# Patient Record
Sex: Male | Born: 2000 | Race: Black or African American | Hispanic: No | Marital: Single | Smoking: Never smoker
Health system: Southern US, Community
[De-identification: ages and names within clinical notes are randomized; demographics above are authoritative.]

## PROBLEM LIST (undated history)

## (undated) DIAGNOSIS — T7840XA Allergy, unspecified, initial encounter: Secondary | ICD-10-CM

---
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
## 2021-08-07 ENCOUNTER — Ambulatory Visit (INDEPENDENT_AMBULATORY_CARE_PROVIDER_SITE_OTHER): Payer: Medicaid Other

## 2021-08-07 ENCOUNTER — Ambulatory Visit
Admission: EM | Admit: 2021-08-07 | Discharge: 2021-08-07 | Disposition: A | Discharge status: Home / Self Care | Payer: Medicaid Other | Attending: Internal Medicine | Admitting: Internal Medicine

## 2021-08-07 DIAGNOSIS — M79672 Pain in left foot: Secondary | ICD-10-CM

## 2021-08-07 DIAGNOSIS — M79674 Pain in right toe(s): Secondary | ICD-10-CM | POA: Diagnosis not present

## 2021-08-07 MED ORDER — TETANUS-DIPHTH-ACELL PERTUSSIS 5-2.5-18.5 LF-MCG/0.5 IM SUSY
0.5000 mL | PREFILLED_SYRINGE | Freq: Once | INTRAMUSCULAR | Status: AC
  Administered 2021-08-07: 0.5 mL via INTRAMUSCULAR

## 2021-08-07 NOTE — Discharge Instructions (Signed)
Please monitor abrasion very closely for signs of infection that include increased redness, swelling, pus.  Your x-ray was normal and did not show signs of break or dislocation.  Suspect possible sprain versus bruising.  Please use ice application and take over-the-counter pain relievers as needed.  Follow-up with provided contact information for orthopedist if pain persists or worsens.

## 2021-08-07 NOTE — ED Triage Notes (Addendum)
Pt present right big toe injury, pt states he stomped his toe on his dog cage. Symptom happen last Sunday. Pt toe is red and swollen.

## 2021-08-07 NOTE — ED Provider Notes (Signed)
[facility]    CSN: [id number] Arrival date & time: 08/07/21  1402      History   Chief Complaint Chief Complaint  Patient presents with   Toe Pain    HPI Colton Burns is a 21 y.o. male.   Patient presents with left foot and left great toe pain that started about 5 days ago after an injury.  Patient reports that he jumped up out of bed, and his toe got caught on the dog kennel next to his bed.  He reports that his toe was twisted during the injury.  Patient having difficulty bearing weight due to pain.  Is requesting a work note given prolonged standing at his workplace that is exacerbating pain.  Patient does not report taking medication for pain.  Last tetanus vaccine is unknown.  Has abrasion to left great toe but denies any purulent drainage, fever, body aches, chills.   Toe Pain    Past Medical History:  Diagnosis Date   Allergy     Patient Active Problem List   Diagnosis Date Noted   S/P hardware removal 08/17/2014   Fracture, femur, distal (HCC) 12/12/2013    Past Surgical History:  Procedure Laterality Date   HARDWARE REMOVAL Right 08/17/2014   Procedure: Removal Right Distal Femur Screws;  Surgeon: Eldred Manges, MD;  Location: [facility];  Service: Orthopedics;  Laterality: Right;   HERNIA REPAIR  2005   umbilical   KNEE ARTHROSCOPY WITH MEDIAL MENISECTOMY Right 12/14/2019   Procedure: RIGHT KNEE ARTHROSCOPY WITH MEDIAL MENISECTOMY and repair;  Surgeon: Bjorn Pippin, MD;  Location: [facility];  Service: Orthopedics;  Laterality: Right;   ORIF FEMUR FRACTURE Right 12/12/2013   Procedure: OPEN REDUCTION INTERNAL FIXATION (ORIF) DISTAL FEMUR FRACTURE Salter II;  Surgeon: Eldred Manges, MD;  Location: [facility] OR;  Service: Orthopedics;  Laterality: Right;       Home Medications    Prior to Admission medications   Medication Sig Start Date End Date Taking? Authorizing Provider  hydrOXYzine (ATARAX/VISTARIL) 25 MG tablet Take 0.5-1 tablets  (12.5-25 mg total) by mouth every 8 (eight) hours as needed for itching. 11/03/20   Wallis Bamberg, PA-C  predniSONE (DELTASONE) 10 MG tablet Take 3 tablets (30 mg total) by mouth daily with breakfast. 11/03/20   Wallis Bamberg, PA-C    Family History History reviewed. No pertinent family history.  Social History Social History   Tobacco Use   Smoking status: Never   Smokeless tobacco: Never  Vaping Use   Vaping Use: Never used  Substance Use Topics   Alcohol use: No   Drug use: Yes    Types: Marijuana    Comment: daily     Allergies   Penicillins   Review of Systems Review of Systems Per HPI  Physical Exam Triage Vital Signs ED Triage Vitals  Enc Vitals Group     BP 08/07/21 1439 110/60     Pulse Rate 08/07/21 1439 92     Resp 08/07/21 1439 18     Temp 08/07/21 1439 98.1 F (36.7 C)     Temp Source 08/07/21 1439 Oral     SpO2 08/07/21 1439 97 %     Weight 08/07/21 1441 140 lb (63.5 kg)     Height --      Head Circumference --      Peak Flow --      Pain Score 08/07/21 1440 7     Pain Loc --  Pain Edu? --      Excl. in GC? --    No data found.  Updated Vital Signs BP 110/60 (BP Location: Left Arm)   Pulse 92   Temp 98.1 F (36.7 C) (Oral)   Resp 18   Wt 140 lb (63.5 kg)   SpO2 97%   Visual Acuity Right Eye Distance:   Left Eye Distance:   Bilateral Distance:    Right Eye Near:   Left Eye Near:    Bilateral Near:     Physical Exam Constitutional:      General: He is not in acute distress.    Appearance: Normal appearance. He is not toxic-appearing or diaphoretic.  HENT:     Head: Normocephalic and atraumatic.  Eyes:     Extraocular Movements: Extraocular movements intact.     Conjunctiva/sclera: Conjunctivae normal.  Pulmonary:     Effort: Pulmonary effort is normal.  Feet:     Comments: Small superficial abrasion that is approximately 0.5 cm in length present to proximal joint of left great toe.  Mild swelling and surrounding erythema  located to abrasion.  No purulent drainage noted.  Tenderness to palpation to surrounding area of abrasion and slightly into dorsal surface of left foot surrounding first metatarsal.  No pain to distal end of toe.  Limited range of motion of great toe given pain but patient is able to move toe.  Patient able to move other toes as well.  Full range of motion of ankle and foot.  Capillary refill and pulses normal. Neurological:     General: No focal deficit present.     Mental Status: He is alert and oriented to person, place, and time. Mental status is at baseline.  Psychiatric:        Mood and Affect: Mood normal.        Behavior: Behavior normal.        Thought Content: Thought content normal.        Judgment: Judgment normal.      UC Treatments / Results  Labs (all labs ordered are listed, but only abnormal results are displayed) Labs Reviewed - No data to display  EKG   Radiology DG Foot Complete Right  Result Date: 08/07/2021 CLINICAL DATA:  Right big toe injury EXAM: RIGHT FOOT COMPLETE - 3+ VIEW COMPARISON:  None Available. FINDINGS: No acute fracture or dislocation.The joint spaces are preserved.Alignment is unremarkable. Mild soft tissue swelling about the big toe. No other significant soft tissue abnormality or foreign body. IMPRESSION: No acute osseous abnormality. Electronically Signed   By: Wiliam KeAlison  Vasan M.D.   On: 08/07/2021 15:02    Procedures Procedures (including critical care time)  Medications Ordered in UC Medications  Tdap (BOOSTRIX) injection 0.5 mL (0.5 mLs Intramuscular Given 08/07/21 1507)    Initial Impression / Assessment and Plan / UC Course  I have reviewed the triage vital signs and the nursing notes.  Pertinent labs & imaging results that were available during my care of the patient were reviewed by me and considered in my medical decision making (see chart for details).     Left foot x-ray was negative for any acute bony abnormality.  Suspect  muscular injury/strain versus contusion.  Abrasion to left foot does not appear infected.  Patient advised to keep clean and to monitor for signs of infection.  Tetanus vaccine updated today.  Discussed supportive care, ice application, over-the-counter pain relievers.  Patient provided with contact information for orthopedist if pain persists  or worsens.  Discussed return precautions.  Patient verbalized understanding and was agreeable with plan. Final Clinical Impressions(s) / UC Diagnoses   Final diagnoses:  Left foot pain     Discharge Instructions      Please monitor abrasion very closely for signs of infection that include increased redness, swelling, pus.  Your x-ray was normal and did not show signs of break or dislocation.  Suspect possible sprain versus bruising.  Please use ice application and take over-the-counter pain relievers as needed.  Follow-up with provided contact information for orthopedist if pain persists or worsens.    ED Prescriptions   None    PDMP not reviewed this encounter.   Gustavus Bryant, [place] 08/07/21 1520

## 2023-09-30 ENCOUNTER — Ambulatory Visit
Admission: EM | Admit: 2023-09-30 | Discharge: 2023-09-30 | Disposition: A | Discharge status: Home / Self Care | Attending: Family Medicine | Admitting: Family Medicine

## 2023-09-30 DIAGNOSIS — L309 Dermatitis, unspecified: Secondary | ICD-10-CM | POA: Diagnosis not present

## 2023-09-30 DIAGNOSIS — J209 Acute bronchitis, unspecified: Secondary | ICD-10-CM

## 2023-09-30 MED ORDER — PROMETHAZINE-DM 6.25-15 MG/5ML PO SYRP: 5.0000 mL | ORAL_SOLUTION | Freq: Four times a day (QID) | ORAL | 0 refills | Status: AC | PRN

## 2023-09-30 MED ORDER — AZITHROMYCIN 250 MG PO TABS: 250.0000 mg | ORAL_TABLET | Freq: Every day | ORAL | 0 refills | Status: AC

## 2023-09-30 MED ORDER — TRIAMCINOLONE ACETONIDE 0.1 % EX CREA: 1.0000 | TOPICAL_CREAM | Freq: Two times a day (BID) | CUTANEOUS | 0 refills | Status: AC

## 2023-09-30 NOTE — ED Triage Notes (Signed)
 Pt present with c/o a cough x one week. States he has a runny nose, throat dryness. He has taken DayQuil for relief. Pt also states he has a rash on his hands and forearm.

## 2023-09-30 NOTE — ED Provider Notes (Signed)
 [facility]    CSN: [id number] Arrival date & time: 09/30/23  1417      History   Chief Complaint Chief Complaint  Patient presents with   Cough   Rash    HPI Colton Burns is a 23 y.o. male  presents for evaluation of URI symptoms for 7 days. Patient reports associated symptoms of cough, congestion and sinus pressure if he bends over. Denies N/V/D, fevers, ear pain, sore throat, body aches, shortness of breath. Patient does note have a hx of asthma. Patient is an active smoker.   Reports no sick contacts.  In addition he reports a pruritic rash on bilateral hands and forearm for the past week.  Does state he works at [employer] and comes in contact with a lot of different products.  Does have a history of eczema but states it seems more consistent with his eczema.  pt has taken nothing OTC for symptoms. Pt has no other concerns at this time.    Cough Associated symptoms: rash   Rash   Past Medical History:  Diagnosis Date   Allergy     Patient Active Problem List   Diagnosis Date Noted   S/P hardware removal 08/17/2014   Fracture, femur, distal (HCC) 12/12/2013    Past Surgical History:  Procedure Laterality Date   HARDWARE REMOVAL Right 08/17/2014   Procedure: Removal Right Distal Femur Screws;  Surgeon: Oneil JAYSON Herald, MD;  Location: [facility];  Service: Orthopedics;  Laterality: Right;   HERNIA REPAIR  2005   umbilical   KNEE ARTHROSCOPY WITH MEDIAL MENISECTOMY Right 12/14/2019   Procedure: RIGHT KNEE ARTHROSCOPY WITH MEDIAL MENISECTOMY and repair;  Surgeon: Cristy Bonner DASEN, MD;  Location: [facility];  Service: Orthopedics;  Laterality: Right;   ORIF FEMUR FRACTURE Right 12/12/2013   Procedure: OPEN REDUCTION INTERNAL FIXATION (ORIF) DISTAL FEMUR FRACTURE Salter II;  Surgeon: Oneil JAYSON Herald, MD;  Location: [facility];  Service: Orthopedics;  Laterality: Right;       Home Medications    Prior to Admission medications   Medication Sig Start Date End Date  Taking? Authorizing Provider  azithromycin  (ZITHROMAX ) 250 MG tablet Take 1 tablet (250 mg total) by mouth daily. Take first 2 tablets together, then 1 every day until finished. 09/30/23  Yes Yuvia Plant, Jodi R, NP  promethazine -dextromethorphan (PROMETHAZINE -DM) 6.25-15 MG/5ML syrup Take 5 mLs by mouth 4 (four) times daily as needed for cough. 09/30/23  Yes Aboubacar Matsuo, Jodi R, NP  triamcinolone  cream (KENALOG ) 0.1 % Apply 1 Application topically 2 (two) times daily. 09/30/23  Yes Tyvion Edmondson, Jodi R, NP  hydrOXYzine  (ATARAX /VISTARIL ) 25 MG tablet Take 0.5-1 tablets (12.5-25 mg total) by mouth every 8 (eight) hours as needed for itching. 11/03/20   Christopher Savannah, PA-C  predniSONE  (DELTASONE ) 10 MG tablet Take 3 tablets (30 mg total) by mouth daily with breakfast. 11/03/20   Christopher Savannah, PA-C    Family History History reviewed. No pertinent family history.  Social History Social History   Tobacco Use   Smoking status: Never   Smokeless tobacco: Never  Vaping Use   Vaping status: Never Used  Substance Use Topics   Alcohol use: No   Drug use: Yes    Types: Marijuana    Comment: daily     Allergies   Penicillins   Review of Systems Review of Systems  HENT:  Positive for congestion and sinus pressure.   Respiratory:  Positive for cough.   Skin:  Positive for rash.  Physical Exam Triage Vital Signs ED Triage Vitals  Encounter Vitals Group     BP 09/30/23 1523 116/68     Girls Systolic BP Percentile --      Girls Diastolic BP Percentile --      Boys Systolic BP Percentile --      Boys Diastolic BP Percentile --      Pulse Rate 09/30/23 1523 70     Resp 09/30/23 1523 17     Temp 09/30/23 1523 98 F (36.7 C)     Temp Source 09/30/23 1523 Oral     SpO2 09/30/23 1523 97 %     Weight --      Height --      Head Circumference --      Peak Flow --      Pain Score 09/30/23 1522 4     Pain Loc --      Pain Education --      Exclude from Growth Chart --    No data found.  Updated Vital  Signs BP 116/68 (BP Location: Left Arm)   Pulse 70   Temp 98 F (36.7 C) (Oral)   Resp 17   SpO2 97%   Visual Acuity Right Eye Distance:   Left Eye Distance:   Bilateral Distance:    Right Eye Near:   Left Eye Near:    Bilateral Near:     Physical Exam Vitals and nursing note reviewed.  Constitutional:      General: He is not in acute distress.    Appearance: Normal appearance. He is not ill-appearing or toxic-appearing.  HENT:     Head: Normocephalic and atraumatic.     Right Ear: Tympanic membrane and ear canal normal.     Left Ear: Tympanic membrane and ear canal normal.     Nose: Congestion present.     Right Turbinates: Not swollen or pale.     Left Turbinates: Not swollen or pale.     Right Sinus: No maxillary sinus tenderness or frontal sinus tenderness.     Left Sinus: No maxillary sinus tenderness or frontal sinus tenderness.     Mouth/Throat:     Mouth: Mucous membranes are moist.     Pharynx: No oropharyngeal exudate or posterior oropharyngeal erythema.  Eyes:     Pupils: Pupils are equal, round, and reactive to light.  Cardiovascular:     Rate and Rhythm: Normal rate and regular rhythm.     Heart sounds: Normal heart sounds.  Pulmonary:     Effort: Pulmonary effort is normal.     Breath sounds: Normal breath sounds. No wheezing, rhonchi or rales.  Musculoskeletal:     Cervical back: Normal range of motion and neck supple.  Lymphadenopathy:     Cervical: No cervical adenopathy.  Skin:    General: Skin is warm and dry.     Comments: There is a dry mildly erythematous macular rash scattered on dorsal aspect of hands and forearms.  No rash on palms of hands.  No swelling drainage  Neurological:     General: No focal deficit present.     Mental Status: He is alert and oriented to person, place, and time.  Psychiatric:        Mood and Affect: Mood normal.        Behavior: Behavior normal.      UC Treatments / Results  Labs (all labs ordered are  listed, but only abnormal results are displayed) Labs Reviewed - No  data to display  EKG   Radiology No results found.  Procedures Procedures (including critical care time)  Medications Ordered in UC Medications - No data to display  Initial Impression / Assessment and Plan / UC Course  I have reviewed the triage vital signs and the nursing notes.  Pertinent labs & imaging results that were available during my care of the patient were reviewed by me and considered in my medical decision making (see chart for details).     Reviewed exam and symptoms with patient.  No red flags.  Will start azithromycin  and Promethazine  DM for bronchitis, side effect profile reviewed.  Will do triamcinolone  topically twice daily as needed for the rash.  Advised OTC Aquaphor or CeraVe lotion as well.  PCP follow-up if symptoms do not improve.  ER precautions reviewed. Final Clinical Impressions(s) / UC Diagnoses   Final diagnoses:  Acute bronchitis, unspecified organism  Dermatitis     Discharge Instructions      Start Zithromax  antibiotic as prescribed.  You may take Tessalon 3 times a day as needed for your cough.  Use triamcinolone  topical steroid cream twice daily to the rash areas as needed.  Use over-the-counter Aquaphor or CeraVe lotion for hydration as well.  Lots of rest and fluids.  Please follow-up with your PCP if your symptoms do not improve.  Please go to the ER if you develop any worsening symptoms.  I hope you feel better soon!     ED Prescriptions     Medication Sig Dispense Auth. Provider   promethazine -dextromethorphan (PROMETHAZINE -DM) 6.25-15 MG/5ML syrup Take 5 mLs by mouth 4 (four) times daily as needed for cough. 118 mL Corneilus Heggie, Jodi R, NP   triamcinolone  cream (KENALOG ) 0.1 % Apply 1 Application topically 2 (two) times daily. 45 g Jarin Cornfield, Jodi R, NP   azithromycin  (ZITHROMAX ) 250 MG tablet Take 1 tablet (250 mg total) by mouth daily. Take first 2 tablets together, then 1  every day until finished. 6 tablet Jozlynn Plaia, Jodi R, NP      PDMP not reviewed this encounter.   Loreda Myla SAUNDERS, NP 09/30/23 [phone]

## 2023-09-30 NOTE — Discharge Instructions (Signed)
 Start Zithromax  antibiotic as prescribed.  You may take Tessalon 3 times a day as needed for your cough.  Use triamcinolone  topical steroid cream twice daily to the rash areas as needed.  Use over-the-counter Aquaphor or CeraVe lotion for hydration as well.  Lots of rest and fluids.  Please follow-up with your PCP if your symptoms do not improve.  Please go to the ER if you develop any worsening symptoms.  I hope you feel better soon!
